# Patient Record
Sex: Male | Born: 1995 | Race: White | Hispanic: No | Marital: Single
Health system: Southern US, Community
[De-identification: ages and names within clinical notes are randomized; demographics above are authoritative.]

---
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
## 2016-06-27 NOTE — Progress Notes (Signed)
This encounter was created in error - please disregard.
